# Patient Record
Sex: Female | Born: 1945 | Race: Black or African American | Hispanic: No | Smoking: Current every day smoker
Health system: Southern US, Community
[De-identification: ages and names within clinical notes are randomized; demographics above are authoritative.]

## PROBLEM LIST (undated history)

## (undated) DIAGNOSIS — E079 Disorder of thyroid, unspecified: Secondary | ICD-10-CM

## (undated) DIAGNOSIS — I10 Essential (primary) hypertension: Secondary | ICD-10-CM

## (undated) DIAGNOSIS — E119 Type 2 diabetes mellitus without complications: Secondary | ICD-10-CM

---

## 2021-04-20 ENCOUNTER — Emergency Department: Payer: Medicare HMO

## 2021-04-20 ENCOUNTER — Encounter: Payer: Self-pay | Admitting: Emergency Medicine

## 2021-04-20 ENCOUNTER — Other Ambulatory Visit: Payer: Self-pay

## 2021-04-20 DIAGNOSIS — I10 Essential (primary) hypertension: Secondary | ICD-10-CM | POA: Insufficient documentation

## 2021-04-20 DIAGNOSIS — F17210 Nicotine dependence, cigarettes, uncomplicated: Secondary | ICD-10-CM | POA: Insufficient documentation

## 2021-04-20 DIAGNOSIS — Z79899 Other long term (current) drug therapy: Secondary | ICD-10-CM | POA: Insufficient documentation

## 2021-04-20 DIAGNOSIS — E119 Type 2 diabetes mellitus without complications: Secondary | ICD-10-CM | POA: Diagnosis not present

## 2021-04-20 DIAGNOSIS — R030 Elevated blood-pressure reading, without diagnosis of hypertension: Secondary | ICD-10-CM | POA: Diagnosis present

## 2021-04-20 NOTE — ED Triage Notes (Signed)
Pt arrived via POV with reports of having a "funny feeling" states she had her daughter check her BP and it was elevated at home.  Pt takes Novasc, Losartan and Metoprolol and has not had any missed doses.  Pt states she also had some chest pain in the center of her chest that went away quickly.  Currently denies any pain.

## 2021-04-21 ENCOUNTER — Emergency Department
Admission: EM | Admit: 2021-04-21 | Discharge: 2021-04-21 | Disposition: A | Discharge status: Home / Self Care | Payer: Medicare HMO | Attending: Emergency Medicine | Admitting: Emergency Medicine

## 2021-04-21 ENCOUNTER — Encounter: Payer: Self-pay | Admitting: Radiology

## 2021-04-21 ENCOUNTER — Emergency Department: Payer: Medicare HMO

## 2021-04-21 DIAGNOSIS — I10 Essential (primary) hypertension: Secondary | ICD-10-CM

## 2021-04-21 HISTORY — DX: Disorder of thyroid, unspecified: E07.9

## 2021-04-21 HISTORY — DX: Essential (primary) hypertension: I10

## 2021-04-21 HISTORY — DX: Type 2 diabetes mellitus without complications: E11.9

## 2021-04-21 LAB — CBC
HCT: 45.9 % (ref 36.0–46.0)
Hemoglobin: 14.5 g/dL (ref 12.0–15.0)
MCH: 24.5 pg — ABNORMAL LOW (ref 26.0–34.0)
MCHC: 31.6 g/dL (ref 30.0–36.0)
MCV: 77.7 fL — ABNORMAL LOW (ref 80.0–100.0)
Platelets: 200 10*3/uL (ref 150–400)
RBC: 5.91 MIL/uL — ABNORMAL HIGH (ref 3.87–5.11)
RDW: 15.8 % — ABNORMAL HIGH (ref 11.5–15.5)
WBC: 9.8 10*3/uL (ref 4.0–10.5)
nRBC: 0 % (ref 0.0–0.2)

## 2021-04-21 LAB — URINALYSIS, COMPLETE (UACMP) WITH MICROSCOPIC
Bilirubin Urine: NEGATIVE
Glucose, UA: NEGATIVE mg/dL
Ketones, ur: NEGATIVE mg/dL
Leukocytes,Ua: NEGATIVE
Nitrite: NEGATIVE
Protein, ur: NEGATIVE mg/dL
Specific Gravity, Urine: 1.004 — ABNORMAL LOW (ref 1.005–1.030)
pH: 7 (ref 5.0–8.0)

## 2021-04-21 LAB — BASIC METABOLIC PANEL
Anion gap: 6 (ref 5–15)
BUN: 14 mg/dL (ref 8–23)
CO2: 27 mmol/L (ref 22–32)
Calcium: 9.5 mg/dL (ref 8.9–10.3)
Chloride: 108 mmol/L (ref 98–111)
Creatinine, Ser: 0.98 mg/dL (ref 0.44–1.00)
GFR, Estimated: 59 mL/min — ABNORMAL LOW (ref >60)
Glucose, Bld: 171 mg/dL — ABNORMAL HIGH (ref 70–99)
Potassium: 3.9 mmol/L (ref 3.5–5.1)
Sodium: 141 mmol/L (ref 135–145)

## 2021-04-21 LAB — TROPONIN I (HIGH SENSITIVITY)
Troponin I (High Sensitivity): 5 ng/L (ref <18)
Troponin I (High Sensitivity): 5 ng/L (ref <18)

## 2021-04-21 LAB — T4, FREE: Free T4: 0.91 ng/dL (ref 0.61–1.12)

## 2021-04-21 LAB — TSH: TSH: 3.57 u[IU]/mL (ref 0.350–4.500)

## 2021-04-21 MED ORDER — CLONIDINE HCL 0.1 MG PO TABS: ORAL_TABLET | ORAL | 0 refills | Status: AC

## 2021-04-21 NOTE — ED Provider Notes (Signed)
[facility] Emergency Department Provider Note   ____________________________________________   Event Date/Time   First MD Initiated Contact with Patient 04/21/21 0209     (approximate)  I have reviewed the triage vital signs and the nursing notes.   HISTORY  Chief Complaint Hypertension and Chest Pain    HPI Stefanie Roberts is a 75 y.o. female who presents to the ED from home with a chief complaint of elevated blood pressure.  Patient reports having a "funny feeling" and states her daughter checked her blood pressure and it was elevated.  Patient takes Norvasc, Losartan and Metoprolol.  She has taking these medicines for years and has not had any recent medication adjustments.  Reports transient chest pain not associated with diaphoresis, shortness of breath, palpitations, nausea, vomiting or dizziness.  Denies COVID-19 concerns.     Past Medical History:  Diagnosis Date   Diabetes mellitus without complication (HCC)    Hypertension    Thyroid disease     There are no problems to display for this patient.   History reviewed. No pertinent surgical history.  Prior to Admission medications   Medication Sig Start Date End Date Taking? Authorizing Provider  cloNIDine (CATAPRES) 0.1 MG tablet Take up to twice daily as needed for: SBP top blood pressure number > 200 DBP bottom blood pressure number > 100 04/21/21  Yes Irean Hong, MD    Allergies Darvon [propoxyphene] and Lisinopril  History reviewed. No pertinent family history.  Social History Social History   Tobacco Use   Smoking status: Every Day    Packs/day: 0.50    Types: Cigarettes   Smokeless tobacco: Never    Review of Systems  Constitutional: No fever/chills Eyes: No visual changes. ENT: No sore throat. Cardiovascular: Positive for chest pain. Respiratory: Denies shortness of breath. Gastrointestinal: No abdominal pain.  No nausea, no vomiting.  No diarrhea.  No  constipation. Genitourinary: Negative for dysuria. Musculoskeletal: Negative for back pain. Skin: Negative for rash. Neurological: Negative for headaches, focal weakness or numbness.   ____________________________________________   PHYSICAL EXAM:  VITAL SIGNS: ED Triage Vitals [04/20/21 2348]  Enc Vitals Group     BP (!) 173/100     Pulse Rate 82     Resp 18     Temp 97.7 F (36.5 C)     Temp Source Oral     SpO2 96 %     Weight 127 lb (57.6 kg)     Height 5\' 6"  (1.676 m)     Head Circumference      Peak Flow      Pain Score 0     Pain Loc      Pain Edu?      Excl. in GC?     Constitutional: Alert and oriented. Well appearing and in no acute distress. Eyes: Conjunctivae are normal. PERRL. EOMI. Head: Atraumatic. Nose: No congestion/rhinnorhea. Mouth/Throat: Mucous membranes are moist.   Neck: No stridor.  No thyromegaly. Cardiovascular: Normal rate, regular rhythm. Grossly normal heart sounds.  Good peripheral circulation. Respiratory: Normal respiratory effort.  No retractions. Lungs CTAB. Gastrointestinal: Soft and nontender to light or deep palpation. No distention. No abdominal bruits. No CVA tenderness. Musculoskeletal: No lower extremity tenderness nor edema.  No joint effusions. Neurologic: Alert and oriented x3.  CN II to XII grossly intact.  Normal speech and language. No gross focal neurologic deficits are appreciated. No gait instability. Skin:  Skin is warm, dry and intact. No rash noted. Psychiatric:  Mood and affect are normal. Speech and behavior are normal.  ____________________________________________   LABS (all labs ordered are listed, but only abnormal results are displayed)  Labs Reviewed  BASIC METABOLIC PANEL - Abnormal; Notable for the following components:      Result Value   Glucose, Bld 171 (*)    GFR, Estimated 59 (*)    All other components within normal limits  CBC - Abnormal; Notable for the following components:   RBC 5.91 (*)     MCV 77.7 (*)    MCH 24.5 (*)    RDW 15.8 (*)    All other components within normal limits  URINALYSIS, COMPLETE (UACMP) WITH MICROSCOPIC - Abnormal; Notable for the following components:   Color, Urine COLORLESS (*)    APPearance CLEAR (*)    Specific Gravity, Urine 1.004 (*)    Hgb urine dipstick SMALL (*)    Bacteria, UA RARE (*)    All other components within normal limits  TSH  T4, FREE  TROPONIN I (HIGH SENSITIVITY)  TROPONIN I (HIGH SENSITIVITY)   ____________________________________________  EKG  ED ECG REPORT I, Lorijean Husser J, the attending physician, personally viewed and interpreted this ECG.   Date: 04/21/2021  EKG Time: 2353  Rate: 79  Rhythm: normal sinus rhythm  Axis: Normal  Intervals: Premature supraventricular beats  ST&T Change: Nonspecific  ____________________________________________  RADIOLOGY Tawni Millers J, personally viewed and evaluated these images (plain radiographs) as part of my medical decision making, as well as reviewing the written report by the radiologist.  ED MD interpretation: No acute cardiopulmonary process  Official radiology report(s): DG Chest 2 View  Result Date: 04/21/2021 CLINICAL DATA:  Chest pain EXAM: CHEST - 2 VIEW COMPARISON:  None. FINDINGS: Lungs are well expanded, symmetric, and clear. No pneumothorax or pleural effusion. Cardiac size within normal limits. Pulmonary vascularity is normal. Osseous structures are age-appropriate. No acute bone abnormality. IMPRESSION: No active cardiopulmonary disease. Electronically Signed   By: Helyn Numbers MD   On: 04/21/2021 00:23    ____________________________________________   PROCEDURES  Procedure(s) performed (including Critical Care):  .1-3 Lead EKG Interpretation  Date/Time: 04/21/2021 2:39 AM Performed by: Irean Hong, MD Authorized by: Irean Hong, MD     Interpretation: normal     ECG rate:  80   ECG rate assessment: normal     Rhythm: sinus rhythm      Ectopy: none     Conduction: normal   Comments:     Patient placed on cardiac monitor to evaluate for arrhythmias   ____________________________________________   INITIAL IMPRESSION / ASSESSMENT AND PLAN / ED COURSE  As part of my medical decision making, I reviewed the following data within the electronic MEDICAL RECORD NUMBER History obtained from family, Nursing notes reviewed and incorporated, Labs reviewed, EKG interpreted, Old chart reviewed, Radiograph reviewed, and Notes from prior ED visits     75 year old female with hypertension presenting with elevated blood pressure and transient chest pain. Differential diagnosis includes, but is not limited to, ACS, aortic dissection, pulmonary embolism, cardiac tamponade, pneumothorax, pneumonia, pericarditis, myocarditis, GI-related causes including esophagitis/gastritis, and musculoskeletal chest wall pain.     Initial troponin, EKG and chest x-ray unremarkable.  Will repeat troponin, check thyroid panel and reassess.  Clinical Course as of 04/21/21 0456  Wynelle Link Apr 21, 2021  0223 Patient's antihypertensives: Amlodipine 10mg  qd Metoprolol 25mg  bid Losartan 100mg  [JS]  Updated patient and daughter on all test results.  Blood pressure improved without intervention.  Will discharge home with prescription for clonidine to use as needed with strict parameters.  Patient will follow up with her PCP early next week.  Strict return precautions given.  Both verbalized understanding agree with plan of care. [JS]    Clinical Course User Index [JS] Irean Hong, MD     ____________________________________________   FINAL CLINICAL IMPRESSION(S) / ED DIAGNOSES  Final diagnoses:  Hypertension, unspecified type     ED Discharge Orders          Ordered    cloNIDine (CATAPRES) 0.1 MG tablet        04/21/21 0326             Note:  This document was prepared using Dragon voice recognition software and may include unintentional  dictation errors.    Irean Hong, MD 04/21/21 [phone]

## 2021-04-21 NOTE — ED Notes (Signed)
Pt. Placed on cont. Cardiac monitoring. NAD.

## 2021-04-21 NOTE — Discharge Instructions (Addendum)
1.  You may take Clonidine 0.1mg  up to twice daily for: Top number of blood pressure>200 Bottom number of blood pressure>100 2.  If you are able to take your blood pressure at home, record a log of your blood pressures morning, noon and evening.  Please bring these to your doctor next week. 3.  Return to the ER for worsening symptoms, persistent vomiting, difficulty breathing or other concerns.

## 2021-09-14 ENCOUNTER — Emergency Department
Admission: EM | Admit: 2021-09-14 | Discharge: 2021-09-14 | Disposition: A | Discharge status: Home / Self Care | Payer: Medicare HMO | Attending: Emergency Medicine | Admitting: Emergency Medicine

## 2021-09-14 ENCOUNTER — Other Ambulatory Visit: Payer: Self-pay

## 2021-09-14 ENCOUNTER — Encounter: Payer: Self-pay | Admitting: Emergency Medicine

## 2021-09-14 DIAGNOSIS — U071 COVID-19: Secondary | ICD-10-CM | POA: Insufficient documentation

## 2021-09-14 DIAGNOSIS — E119 Type 2 diabetes mellitus without complications: Secondary | ICD-10-CM | POA: Diagnosis not present

## 2021-09-14 DIAGNOSIS — F17210 Nicotine dependence, cigarettes, uncomplicated: Secondary | ICD-10-CM | POA: Diagnosis not present

## 2021-09-14 DIAGNOSIS — I10 Essential (primary) hypertension: Secondary | ICD-10-CM | POA: Insufficient documentation

## 2021-09-14 DIAGNOSIS — R059 Cough, unspecified: Secondary | ICD-10-CM | POA: Diagnosis present

## 2021-09-14 LAB — RESP PANEL BY RT-PCR (FLU A&B, COVID) ARPGX2
Influenza A by PCR: NEGATIVE
Influenza B by PCR: NEGATIVE
SARS Coronavirus 2 by RT PCR: POSITIVE — AB

## 2021-09-14 NOTE — Discharge Instructions (Signed)
You can take Tylenol and Ibuprofen alternating at home for Fever.

## 2021-09-14 NOTE — ED Triage Notes (Signed)
Pt via POV from home. Daughter wanted her to be tested for COVID. Family has been sick. Pt denies any symptoms. Pt is A&Ox4 and NAD

## 2021-09-14 NOTE — ED Provider Notes (Signed)
[facility]-EMERGENCY DEPARTMENT  ____________________________________________  Time seen: Approximately 5:54 PM  I have reviewed the triage vital signs and the nursing notes.   HISTORY  Chief Complaint Covid Exposure   Historian Patient     HPI Stefanie Roberts is a 75 y.o. female with a history of diabetes and hypertension, presents to the emergency department for COVID-19 testing.  Patient has had sporadic cough but denies other symptoms.  Multiple family members in the home of tested positive for COVID-19.  No chest pain, chest tightness or abdominal pain.   Past Medical History:  Diagnosis Date   Diabetes mellitus without complication (HCC)    Hypertension    Thyroid disease      Immunizations up to date:  Yes.     Past Medical History:  Diagnosis Date   Diabetes mellitus without complication (HCC)    Hypertension    Thyroid disease     There are no problems to display for this patient.   History reviewed. No pertinent surgical history.  Prior to Admission medications   Medication Sig Start Date End Date Taking? Authorizing Provider  cloNIDine (CATAPRES) 0.1 MG tablet Take up to twice daily as needed for: SBP top blood pressure number > 200 DBP bottom blood pressure number > 100 04/21/21   Irean Hong, MD    Allergies Darvon [propoxyphene] and Lisinopril  History reviewed. No pertinent family history.  Social History Social History   Tobacco Use   Smoking status: Every Day    Packs/day: 0.50    Types: Cigarettes   Smokeless tobacco: Never     Review of Systems  Constitutional: No fever/chills Eyes:  No discharge ENT: No upper respiratory complaints. Respiratory: Patient has cough.  Gastrointestinal:   No nausea, no vomiting.  No diarrhea.  No constipation. Musculoskeletal: Negative for musculoskeletal pain. Skin: Negative for rash, abrasions, lacerations, ecchymosis.  ____________________________________________   PHYSICAL EXAM:  VITAL  SIGNS: ED Triage Vitals  Enc Vitals Group     BP 09/14/21 1548 (!) 159/77     Pulse Rate 09/14/21 1548 87     Resp 09/14/21 1548 18     Temp 09/14/21 1548 99.2 F (37.3 C)     Temp Source 09/14/21 1548 Oral     SpO2 09/14/21 1548 94 %     Weight 09/14/21 1555 125 lb (56.7 kg)     Height 09/14/21 1555 5\' 6"  (1.676 m)     Head Circumference --      Peak Flow --      Pain Score 09/14/21 1554 0     Pain Loc --      Pain Edu? --      Excl. in GC? --      Constitutional: Alert and oriented. Patient is lying supine. Eyes: Conjunctivae are normal. PERRL. EOMI. Head: Atraumatic. ENT:      Ears: Tympanic membranes are mildly injected with mild effusion bilaterally.       Nose: No congestion/rhinnorhea.      Mouth/Throat: Mucous membranes are moist. Posterior pharynx is mildly erythematous.  Hematological/Lymphatic/Immunilogical: No cervical lymphadenopathy.  Cardiovascular: Normal rate, regular rhythm. Normal S1 and S2.  Good peripheral circulation. Respiratory: Normal respiratory effort without tachypnea or retractions. Lungs CTAB. Good air entry to the bases with no decreased or absent breath sounds. Gastrointestinal: Bowel sounds 4 quadrants. Soft and nontender to palpation. No guarding or rigidity. No palpable masses. No distention. No CVA tenderness. Musculoskeletal: Full range of motion to all extremities. No gross deformities appreciated.  Neurologic:  Normal speech and language. No gross focal neurologic deficits are appreciated.  Skin:  Skin is warm, dry and intact. No rash noted. Psychiatric: Mood and affect are normal. Speech and behavior are normal. Patient exhibits appropriate insight and judgement.   ____________________________________________   LABS (all labs ordered are listed, but only abnormal results are displayed)  Labs Reviewed  RESP PANEL BY RT-PCR (FLU A&B, COVID) ARPGX2 - Abnormal; Notable for the following components:      Result Value   SARS Coronavirus  2 by RT PCR POSITIVE (*)    All other components within normal limits   ____________________________________________  EKG   ____________________________________________  RADIOLOGY   No results found.  ____________________________________________    PROCEDURES  Procedure(s) performed:     Procedures     Medications - No data to display   ____________________________________________   INITIAL IMPRESSION / ASSESSMENT AND PLAN / ED COURSE  Pertinent labs & imaging results that were available during my care of the patient were reviewed by me and considered in my medical decision making (see chart for details).    Assessment and plan COVID-30 75 year old female presents to the emergency department with cold-like symptoms with sick contacts in the home that tested positive for COVID-19.  Patient tested positive for COVID-19.  Patient declined Paxlovid.   Tylenol and ibuprofen alternating were recommended for fever.  Rest and hydration were encouraged at home.  Return precautions were given to return with new or worsening symptoms.      ____________________________________________  FINAL CLINICAL IMPRESSION(S) / ED DIAGNOSES  Final diagnoses:  COVID-19      NEW MEDICATIONS STARTED DURING THIS VISIT:  ED Discharge Orders     None           This chart was dictated using voice recognition software/Dragon. Despite best efforts to proofread, errors can occur which can change the meaning. Any change was purely unintentional.     Orvil Feil, PA-C 09/14/21 1757    Concha Se, MD 09/14/21 [phone]

## 2022-01-13 ENCOUNTER — Encounter: Payer: Self-pay | Admitting: Emergency Medicine

## 2022-01-13 ENCOUNTER — Emergency Department
Admission: EM | Admit: 2022-01-13 | Discharge: 2022-01-13 | Disposition: A | Discharge status: Home / Self Care | Payer: Medicare Other | Attending: Emergency Medicine | Admitting: Emergency Medicine

## 2022-01-13 ENCOUNTER — Emergency Department: Payer: Medicare Other

## 2022-01-13 DIAGNOSIS — R079 Chest pain, unspecified: Secondary | ICD-10-CM | POA: Diagnosis present

## 2022-01-13 DIAGNOSIS — R0789 Other chest pain: Secondary | ICD-10-CM | POA: Diagnosis not present

## 2022-01-13 DIAGNOSIS — I10 Essential (primary) hypertension: Secondary | ICD-10-CM | POA: Insufficient documentation

## 2022-01-13 DIAGNOSIS — I2510 Atherosclerotic heart disease of native coronary artery without angina pectoris: Secondary | ICD-10-CM | POA: Insufficient documentation

## 2022-01-13 LAB — URINALYSIS, ROUTINE W REFLEX MICROSCOPIC
Bilirubin Urine: NEGATIVE
Glucose, UA: NEGATIVE mg/dL
Hgb urine dipstick: NEGATIVE
Ketones, ur: NEGATIVE mg/dL
Leukocytes,Ua: NEGATIVE
Nitrite: NEGATIVE
Protein, ur: NEGATIVE mg/dL
Specific Gravity, Urine: 1.004 — ABNORMAL LOW (ref 1.005–1.030)
pH: 7 (ref 5.0–8.0)

## 2022-01-13 LAB — BASIC METABOLIC PANEL
Anion gap: 8 (ref 5–15)
BUN: 18 mg/dL (ref 8–23)
CO2: 25 mmol/L (ref 22–32)
Calcium: 9.8 mg/dL (ref 8.9–10.3)
Chloride: 105 mmol/L (ref 98–111)
Creatinine, Ser: 0.93 mg/dL (ref 0.44–1.00)
GFR, Estimated: >60 mL/min (ref >60)
Glucose, Bld: 133 mg/dL — ABNORMAL HIGH (ref 70–99)
Potassium: 3.8 mmol/L (ref 3.5–5.1)
Sodium: 138 mmol/L (ref 135–145)

## 2022-01-13 LAB — CBC
HCT: 40.4 % (ref 36.0–46.0)
Hemoglobin: 12.2 g/dL (ref 12.0–15.0)
MCH: 24 pg — ABNORMAL LOW (ref 26.0–34.0)
MCHC: 30.2 g/dL (ref 30.0–36.0)
MCV: 79.5 fL — ABNORMAL LOW (ref 80.0–100.0)
Platelets: 225 10*3/uL (ref 150–400)
RBC: 5.08 MIL/uL (ref 3.87–5.11)
RDW: 15.5 % (ref 11.5–15.5)
WBC: 9.5 10*3/uL (ref 4.0–10.5)
nRBC: 0 % (ref 0.0–0.2)

## 2022-01-13 LAB — TROPONIN I (HIGH SENSITIVITY)
Troponin I (High Sensitivity): 5 ng/L (ref <18)
Troponin I (High Sensitivity): 5 ng/L (ref <18)

## 2022-01-13 NOTE — ED Notes (Signed)
MD at the bedside  

## 2022-01-13 NOTE — ED Triage Notes (Signed)
Pt presents via POV with complaints of midsternal CP that radiates towards the left side of her chest that started 1 hour ago. Hx of HTN and is complaint with her medications. Denies SOB.  ?

## 2022-01-13 NOTE — ED Notes (Signed)
Pt ambulated to BR via self, steady gait noted, pt reports no complaints  ?

## 2022-01-13 NOTE — ED Provider Notes (Signed)
? ?[facility] ?Provider Note ? ? ? Event Date/Time  ? First MD Initiated Contact with Patient 01/13/22 2014   ?  (approximate) ? ? ?History  ? ?Chest Pain ? ? ?HPI ? ?Stefanie Roberts is a 76 y.o. female here with transient chest pain.  The patient states that prior to arrival, she was talking with her family when she experienced acute, severe, sharp, pain that shot across her chest.  It lasted less than 1 to 2 minutes.  It then resolved.  She does not feel any shortness of breath with this.  She did not feel any nausea.  No diaphoresis.  Symptoms have now resolved.  She has a history of atypical chest pain, and did have stress test within the last few years which was normal.  Denies any recent medication changes.  No fevers or chills.  No recent sick contacts. ?  ? ? ?Physical Exam  ? ?Triage Vital Signs: ?ED Triage Vitals  ?Enc Vitals Group  ?   BP 01/13/22 1903 (!) 180/76  ?   Pulse Rate 01/13/22 1903 69  ?   Resp 01/13/22 1903 18  ?   Temp 01/13/22 1903 98 ?F (36.7 ?C)  ?   Temp src --   ?   SpO2 01/13/22 1903 100 %  ?   Weight 01/13/22 1902 132 lb 4.4 oz (60 kg)  ?   Height 01/13/22 1902 5\' 6"  (1.676 m)  ?   Head Circumference --   ?   Peak Flow --   ?   Pain Score 01/13/22 1901 2  ?   Pain Loc --   ?   Pain Edu? --   ?   Excl. in GC? --   ? ? ?Most recent vital signs: ?Vitals:  ? 01/13/22 2200 01/13/22 2230  ?BP: (!) 154/72 (!) 159/103  ?Pulse: (!) 52 65  ?Resp: 20 18  ?Temp:    ?SpO2: 97% 96%  ? ? ? ?General: Awake, no distress.  ?CV:  Good peripheral perfusion.  Regular rate and rhythm.  No murmurs or rubs. ?Resp:  Normal effort.  Lungs clear to auscultation bilaterally. ?Abd:  No distention.  No tenderness. ?Other:  No chest wall tenderness.  No lower extremity edema. ? ? ?ED Results / Procedures / Treatments  ? ?Labs ?(all labs ordered are listed, but only abnormal results are displayed) ?Labs Reviewed  ?BASIC METABOLIC PANEL - Abnormal; Notable for the following components:  ?     Result Value  ? Glucose, Bld 133 (*)   ? All other components within normal limits  ?CBC - Abnormal; Notable for the following components:  ? MCV 79.5 (*)   ? MCH 24.0 (*)   ? All other components within normal limits  ?URINALYSIS, ROUTINE W REFLEX MICROSCOPIC - Abnormal; Notable for the following components:  ? Color, Urine COLORLESS (*)   ? APPearance CLEAR (*)   ? Specific Gravity, Urine 1.004 (*)   ? All other components within normal limits  ?TROPONIN I (HIGH SENSITIVITY)  ?TROPONIN I (HIGH SENSITIVITY)  ? ? ? ?EKG ?Sinus rhythm with PACs.  PR 132, QRS 64, QTc 455.  No acute ST elevations or depressions. ? ? ?RADIOLOGY ?Chest x-ray: No active disease ? ? ?I also independently reviewed and agree wit radiologist interpretations. ? ? ?PROCEDURES: ? ?Critical Care performed: No ? ? ?.1-3 Lead EKG Interpretation ?Performed by: [id number], MD ?Authorized by: Shaune Pollack, MD  ? ?  Interpretation: normal   ?  ECG rate:  60-80 ?  ECG rate assessment: normal   ?  Rhythm: sinus rhythm   ?  Ectopy: none   ?  Conduction: normal   ?Comments:  ?   Indication: Chest pain ? ? ? ?MEDICATIONS ORDERED IN ED: ?Medications - No data to display ? ? ?IMPRESSION / MDM / ASSESSMENT AND PLAN / ED COURSE  ?I reviewed the triage vital signs and the nursing notes. ?             ?               ? ? ?The patient is on the cardiac monitor to evaluate for evidence of arrhythmia and/or significant heart rate changes. ? ? ?Ddx:  ?Chest wall pain, GERD/esophagitis, atypical CP, ACS, PE, PNA, PTX, atelectasis ? ? ?MDM:  ?Very pleasant 76 yo F with h/o CAD, HTN, here with fairly atypical chest pain. Suspect MSK chest wall pain given sharp, transient nature with subsequent complete resolution. Pt has a frequent cough related to prior tobacco abuse, and has been lifting her grandson (>30 lb) more than usual. EKg is nonischemic here and trop neg x 2, do not suspect ACS. I reviewed her prior cardiology notes and while she did have high Ca  score, she had a fairly recent neg stress test. CXR reviewed and is clear. Pain is not persistent, and she has no sharp or tearing component, pulses symmetric, do not suspect dissection. No clinical signs of PE/DvT. Labs o/w reassuring. No significant leukocytosis or anemia. No UTI/signs of pyelo. BMP with normal renal function. Will tx supportively with good return precautions.  ? ? ?MEDICATIONS GIVEN IN ED: ?Medications - No data to display ? ? ?Consults:  ?None ? ? ?EMR reviewed  ?Prior cardiology and PCP notes ? ? ? ? ?FINAL CLINICAL IMPRESSION(S) / ED DIAGNOSES  ? ?Final diagnoses:  ?Atypical chest pain  ? ? ? ?Rx / DC Orders  ? ?ED Discharge Orders   ? ? None  ? ?  ? ? ? ?Note:  This document was prepared using Dragon voice recognition software and may include unintentional dictation errors. ?  ?Shaune Pollack, MD ?01/14/22 1228 ? ?

## 2022-10-25 IMAGING — CR DG CHEST 2V
1 series · 2 of 2 positions shown · non-contrast
Comparison: 04/21/2021

CLINICAL DATA: Chest pain

EXAM:
CHEST - 2 VIEW

[Series 1: dg chest 2 view · 0.14mm/px · 2 of 2 slices shown]
[im 1/2]
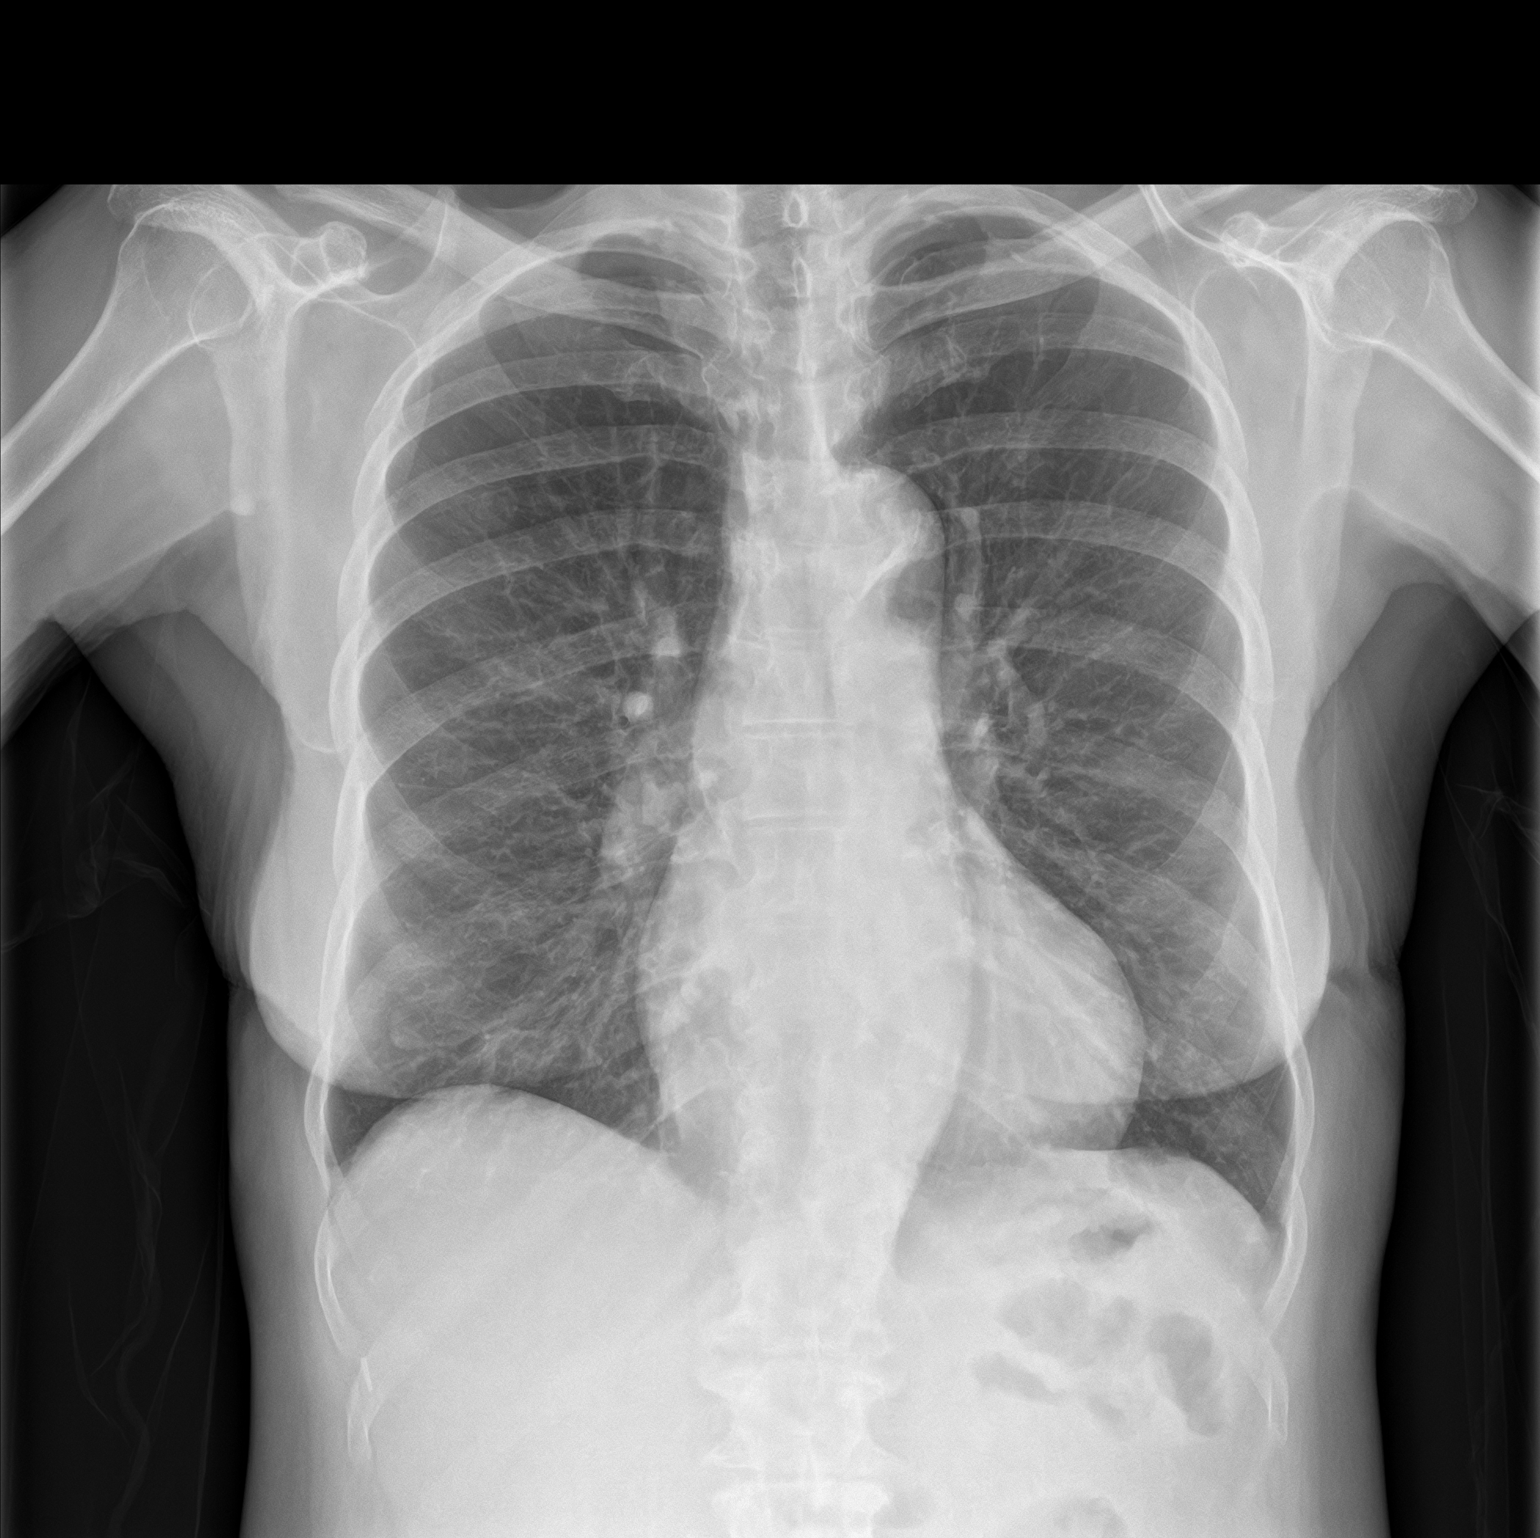
[im 2/2]
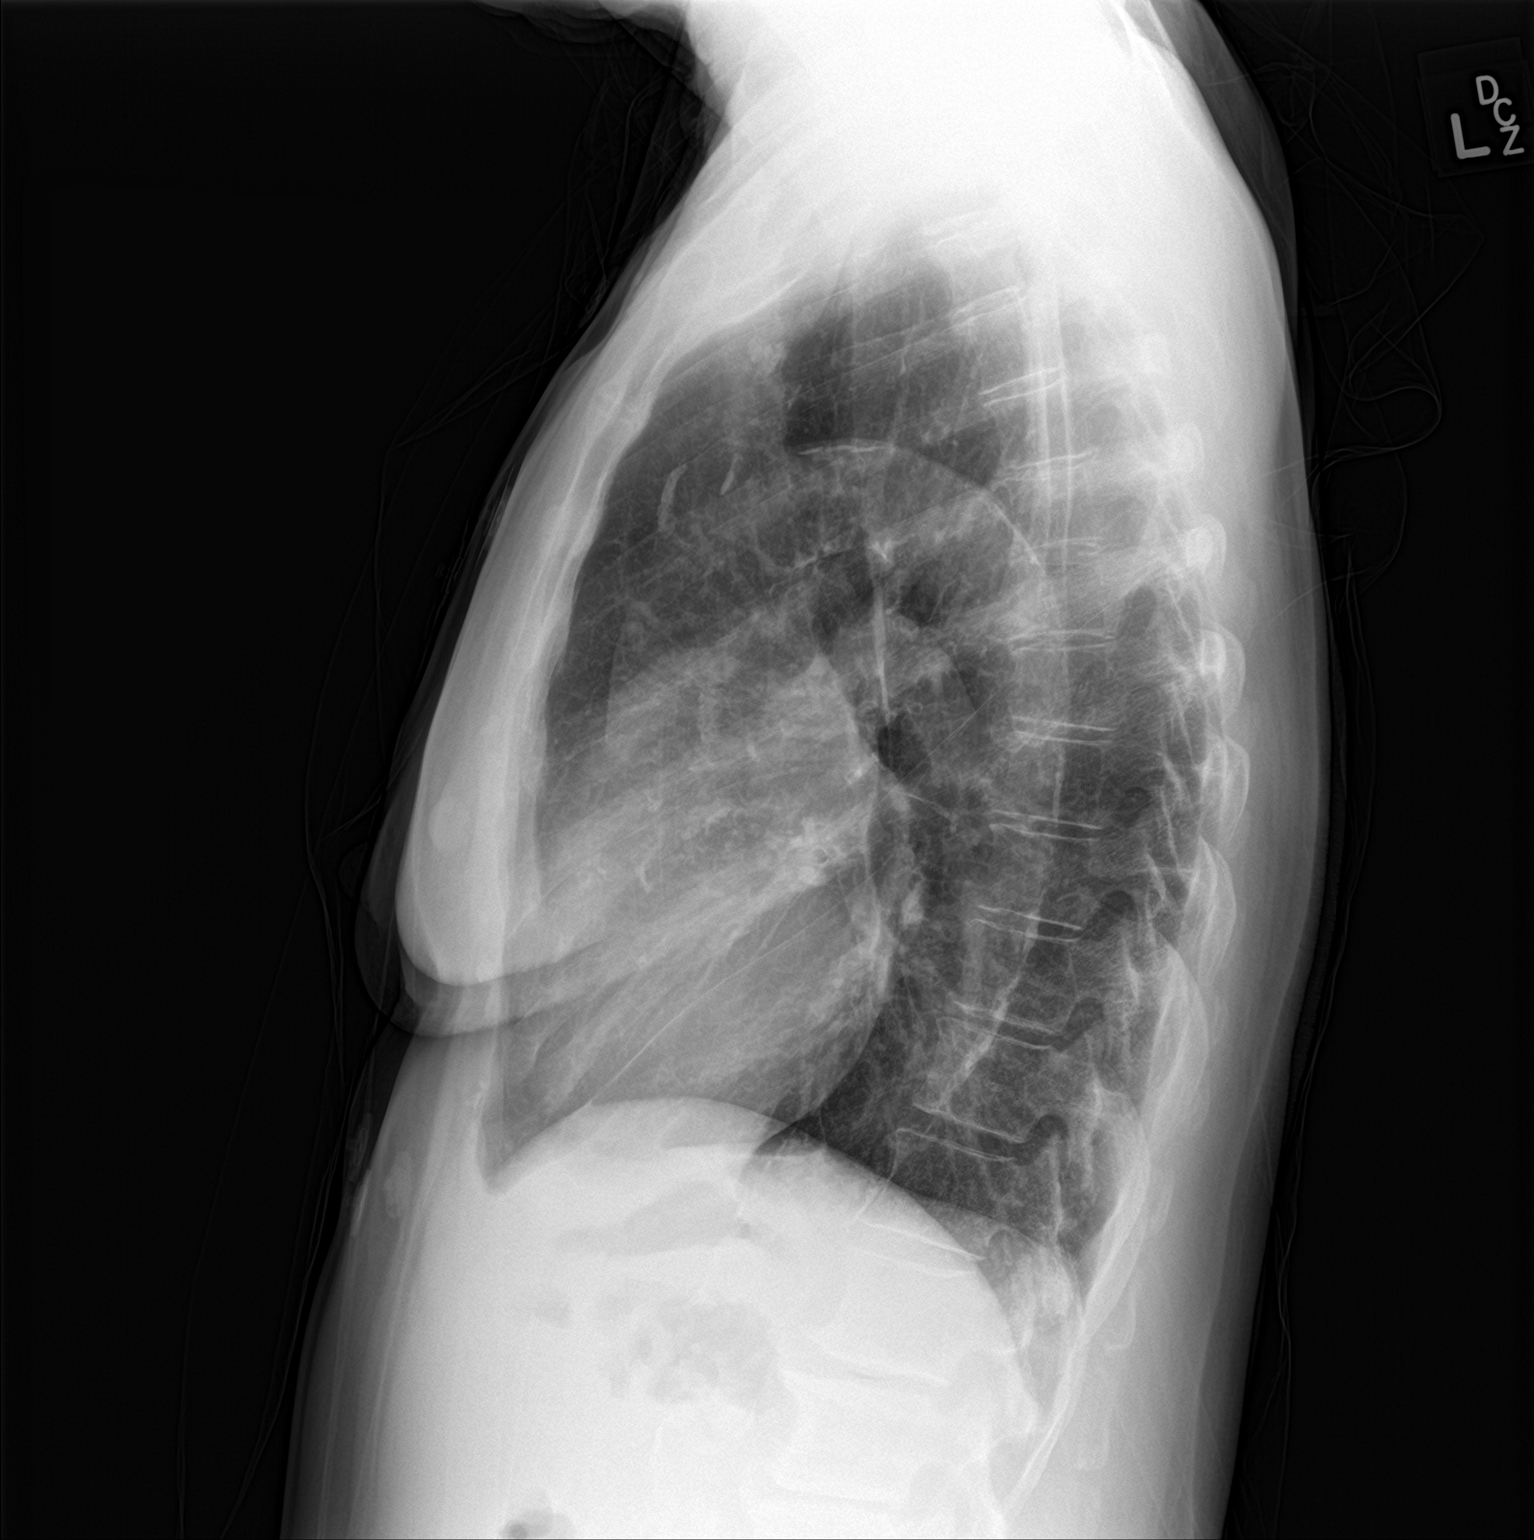

[2 of 2 positions shown; findings below may reference images not displayed]

FINDINGS: The heart size and mediastinal contours are within normal limits.
Both lungs are clear. The visualized skeletal structures are
unremarkable.
IMPRESSION: No active cardiopulmonary disease.

## 2023-02-22 ENCOUNTER — Other Ambulatory Visit: Payer: Self-pay

## 2023-02-22 ENCOUNTER — Emergency Department
Admission: EM | Admit: 2023-02-22 | Discharge: 2023-02-22 | Disposition: A | Discharge status: Home / Self Care | Payer: 59 | Attending: Emergency Medicine | Admitting: Emergency Medicine

## 2023-02-22 DIAGNOSIS — R040 Epistaxis: Secondary | ICD-10-CM | POA: Diagnosis not present

## 2023-02-22 DIAGNOSIS — I10 Essential (primary) hypertension: Secondary | ICD-10-CM | POA: Insufficient documentation

## 2023-02-22 DIAGNOSIS — Z7982 Long term (current) use of aspirin: Secondary | ICD-10-CM | POA: Diagnosis not present

## 2023-02-22 DIAGNOSIS — M5431 Sciatica, right side: Secondary | ICD-10-CM | POA: Diagnosis not present

## 2023-02-22 DIAGNOSIS — E119 Type 2 diabetes mellitus without complications: Secondary | ICD-10-CM | POA: Diagnosis not present

## 2023-02-22 MED ORDER — TRAMADOL HCL 50 MG PO TABS
50.0000 mg | ORAL_TABLET | Freq: Once | ORAL | Status: DC
  Filled 2023-02-22: qty 1

## 2023-02-22 MED ORDER — TRANEXAMIC ACID FOR EPISTAXIS
500.0000 mg | Freq: Once | TOPICAL | Status: DC
  Filled 2023-02-22: qty 10

## 2023-02-22 MED ORDER — OXYMETAZOLINE HCL 0.05 % NA SOLN
1.0000 | Freq: Once | NASAL | Status: AC
  Administered 2023-02-22: 1 via NASAL
  Filled 2023-02-22: qty 30

## 2023-02-22 MED ORDER — TRAMADOL HCL 50 MG PO TABS: 50.0000 mg | ORAL_TABLET | Freq: Four times a day (QID) | ORAL | 0 refills | Status: AC | PRN

## 2023-02-22 MED ORDER — ACETAMINOPHEN 325 MG PO TABS
650.0000 mg | ORAL_TABLET | Freq: Once | ORAL | Status: AC
  Administered 2023-02-22: 650 mg via ORAL
  Filled 2023-02-22: qty 2

## 2023-02-22 NOTE — ED Triage Notes (Signed)
Pt to ed from home for nose bleed that started about 15 mins. Pt advised she was just sitting at home and it started. Pt is CAOx4, in no acute distress and ambulatory in triage. Pt has very minimal bleeding at this time. Pressure applied with towel.

## 2023-02-22 NOTE — Discharge Instructions (Signed)
Please follow-up with primary care if your nosebleed becomes recurrent.  He may use 1 spray and the nostril if the bleeding returns then apply the clamp for 15 minutes.  If bleeding has not stopped, instill 1 additional spray and then clamp again.  If bleeding does not stop after that, you will need to return to the emergency department.

## 2023-02-22 NOTE — ED Provider Notes (Signed)
[facility] Provider Note    Event Date/Time   First MD Initiated Contact with Patient 02/22/23 2103     (approximate)   History   Epistaxis (X 15 mins)   HPI  Stefanie Roberts is a 77 y.o. female with history of hypertension and diabetes and as listed in EMR presents to the emergency department for treatment and evaluation of nosebleed.  Patient states that she was sitting at home watching television when her nose started bleeding.  She is not currently on anticoagulants except for daily baby aspirin.  She is also complaining of right side back pain that radiates into her right lower extremity.  She has had this in the past and typically takes Tylenol but it is not working today.     Physical Exam   Triage Vital Signs: ED Triage Vitals [02/22/23 1917]  Enc Vitals Group     BP 139/76     Pulse Rate 88     Resp 16     Temp 98 F (36.7 C)     Temp Source Oral     SpO2 95 %     Weight 145 lb (65.8 kg)     Height 5\' 6"  (1.676 m)     Head Circumference      Peak Flow      Pain Score 0     Pain Loc      Pain Edu?      Excl. in GC?     Most recent vital signs: Vitals:   02/22/23 1917  BP: 139/76  Pulse: 88  Resp: 16  Temp: 98 F (36.7 C)  SpO2: 95%    General: Awake, no distress.  CV:  Good peripheral perfusion.  Resp:  Normal effort.  Abd:  No distention.  Other:  Scant amount of blood noted in the anterior left nare.   No focal neuro deficits. Straight leg raise is negative. No weakness in the RLE.    ED Results / Procedures / Treatments   Labs (all labs ordered are listed, but only abnormal results are displayed) Labs Reviewed - No data to display   EKG  Not indicated   RADIOLOGY  Image and radiology report reviewed and interpreted by me. Radiology report consistent with the same.  Not indicated.  PROCEDURES:  Critical Care performed: No  Procedures   MEDICATIONS ORDERED IN ED:  Medications  tranexamic acid  (CYKLOKAPRON) 1000 MG/10ML topical solution 500 mg (500 mg Topical Not Given 02/22/23 2214)  traMADol (ULTRAM) tablet 50 mg (50 mg Oral Patient Refused/Not Given 02/22/23 2225)  oxymetazoline (AFRIN) 0.05 % nasal spray 1 spray (1 spray Each Nare Given 02/22/23 1923)  acetaminophen (TYLENOL) tablet 650 mg (650 mg Oral Given 02/22/23 2225)     IMPRESSION / MDM / ASSESSMENT AND PLAN / ED COURSE   I have reviewed the triage note.  Differential diagnosis includes, but is not limited to, anterior epistaxis, posterior epistaxis, acute on chronic back pain, sciatica  Patient's presentation is most consistent with acute illness / injury with system symptoms.  77 year old female presenting to the emergency department for evaluation of epistaxis that started suddenly while watching television.  While awaiting ER room assignment, Afrin was instilled and clamp applied to the nose.  On exam, she has a scant amount of blood just inside the left nostril.  No blood noted in the oropharynx.  Patient is continuously wiping her nose because she feels that it is running.  She was encouraged  to avoid this to see if the bleeding would stop.  Pain in her back is in the right lower sacral area and radiates into the posterior aspect of the right thigh.  Her neuroexam is normal.  She is able to ambulate without assistance.  No further epistaxis.  No need for TXA or packing.  She was encouraged to use 1 spray of Afrin and then clamp for 15 minutes if bleeding returns.  If that process was repeated a second time and she continues to have nosebleeds she was encouraged to return to the emergency department.  In regard to her acute on chronic back pain, she is to follow-up with her primary care provider if Tylenol and tramadol do not help.  She was advised that the tramadol may make her sleepy, drowsy, or dizzy.  She was encouraged to change positions slowly.  If symptoms change or worsen and she is unable to see her primary care  provider she was advised to return to the emergency department.     FINAL CLINICAL IMPRESSION(S) / ED DIAGNOSES   Final diagnoses:  Epistaxis  Sciatica of right side     Rx / DC Orders   ED Discharge Orders          Ordered    traMADol (ULTRAM) 50 MG tablet  Every 6 hours PRN        02/22/23 2216             Note:  This document was prepared using Dragon voice recognition software and may include unintentional dictation errors.   Chinita Pester, FNP 02/22/23 2349    Merwyn Katos, MD 02/23/23 1540

## 2023-02-22 NOTE — ED Notes (Signed)
Clamp placed on nose

## 2023-11-26 ENCOUNTER — Encounter: Payer: Self-pay | Admitting: Emergency Medicine

## 2023-11-26 ENCOUNTER — Emergency Department
Admission: EM | Admit: 2023-11-26 | Discharge: 2023-11-26 | Disposition: A | Discharge status: Home / Self Care | Attending: Emergency Medicine | Admitting: Emergency Medicine

## 2023-11-26 ENCOUNTER — Other Ambulatory Visit: Payer: Self-pay

## 2023-11-26 DIAGNOSIS — R040 Epistaxis: Secondary | ICD-10-CM

## 2023-11-26 MED ORDER — OXYMETAZOLINE HCL 0.05 % NA SOLN
1.0000 | Freq: Once | NASAL | Status: AC
  Administered 2023-11-26: 1 via NASAL
  Filled 2023-11-26: qty 30

## 2023-11-26 MED ORDER — SILVER NITRATE-POT NITRATE 75-25 % EX MISC
CUTANEOUS | Status: AC
  Filled 2023-11-26: qty 10

## 2023-11-26 NOTE — ED Provider Notes (Signed)
 [facility] Provider Note    Event Date/Time   First MD Initiated Contact with Patient 11/26/23 2010     (approximate)   History   Epistaxis   HPI  Stefanie Roberts is a 78 y.o. female   who presents today with history of epistaxis mainly for the left nostril.  Patient states this is a common time that she visits emergency department for epistaxis.      Physical Exam   Triage Vital Signs: ED Triage Vitals  Encounter Vitals Group     BP 11/26/23 1828 (!) 149/81     Systolic BP Percentile --      Diastolic BP Percentile --      Pulse Rate 11/26/23 1828 98     Resp 11/26/23 1828 18     Temp 11/26/23 1828 98.7 F (37.1 C)     Temp Source 11/26/23 1828 Oral     SpO2 11/26/23 1828 95 %     Weight 11/26/23 1824 135 lb (61.2 kg)     Height 11/26/23 1824 5\' 6"  (1.676 m)     Head Circumference --      Peak Flow --      Pain Score 11/26/23 1823 0     Pain Loc --      Pain Education --      Exclude from Growth Chart --     Most recent vital signs: Vitals:   11/26/23 1828  BP: (!) 149/81  Pulse: 98  Resp: 18  Temp: 98.7 F (37.1 C)  SpO2: 95%     Constitutional: Alert, NAD. Able to speak in complete sentences without cough or dyspnea  Eyes: Conjunctivae are normal.  Head: Atraumatic. Nose: No congestion/rhinnorhea. Mouth/Throat: Mucous membranes are moist.  Removed with a nasal clip, evidence of mild epistaxis from both nostrils. Neck: Painless ROM. Supple. No JVD, nodes, thyromegaly  Cardiovascular:   Good peripheral circulation.RRR no murmurs, gallops, rubs  Respiratory: Normal respiratory effort.  No retractions. Clear to auscultation bilaterally without wheezing or crackles  Gastrointestinal: Soft and nontender.  Musculoskeletal:  no deformity Neurologic:  MAE spontaneously. No gross focal neurologic deficits are appreciated.  Skin:  Skin is warm, dry and intact. No rash noted. Psychiatric: Mood and affect are normal. Speech and  behavior are normal.    ED Results / Procedures / Treatments   Labs (all labs ordered are listed, but only abnormal results are displayed) Labs Reviewed - No data to display   EKG    RADIOLOGY    PROCEDURES:  Critical Care performed:   Procedures   MEDICATIONS ORDERED IN ED: Medications  oxymetazoline (AFRIN) 0.05 % nasal spray 1 spray (1 spray Each Nare Given 11/26/23 2107)  silver nitrate applicators 75-25 % applicator (  Given by Other 11/26/23 2125)  silver nitrate applicators 75-25 % applicator (  Return to [facility] 11/26/23 2126)      IMPRESSION / MDM / ASSESSMENT AND PLAN / ED COURSE  I reviewed the triage vital signs and the nursing notes.  Differential diagnosis includes, but is not limited to, epistaxis, fracture, foreign body  Patient's presentation is most consistent with acute, uncomplicated illness.   Patient's diagnosis is consistent with epistaxis. I did review the patient's allergies and medications.Applied gauze with epinephrine that is stopped the bleeding. the patient is in stable and satisfactory condition for discharge home  Patient will be discharged home without prescriptions.  Gave the patient the Afrin to apply as needed for bleeding. Patient is  to follow up with ENT as needed or otherwise directed. Patient is given ED precautions to return to the ED for any worsening or new symptoms. Discussed plan of care with patient, answered all of patient's questions, Patient agreeable to plan of care. Advised patient to take medications according to the instructions on the label. Discussed possible side effects of new medications. Patient verbalized understanding.    FINAL CLINICAL IMPRESSION(S) / ED DIAGNOSES   Final diagnoses:  Epistaxis, recurrent     Rx / DC Orders   ED Discharge Orders     None        Note:  This document was prepared using Dragon voice recognition software and may include unintentional dictation errors.   Albana Damme, PA-C 11/26/23 2159    Concha Se, MD 11/26/23 2241

## 2023-11-26 NOTE — ED Triage Notes (Signed)
 Pt via POV from home. Pt c/o epistaxis that started 10 mins ago. Pt has a hx of HTN and takes a baby ASA. States that she used a spray of Afrin but it did not control the bleeding. Nasal clamp placed on nose at this time and bleeding controlled. Pt is A&Ox4 and NAD

## 2023-11-26 NOTE — Discharge Instructions (Addendum)
 Have been diagnosed with recurrent epistaxis.  Please apply Afrin as needed for bleeding.  Please call tomorrow make an appointment with Dr. Willeen Cass.  Please come back to ED or go to your PCP you have new symptoms or symptoms worsen.

## 2023-11-27 ENCOUNTER — Emergency Department
Admission: EM | Admit: 2023-11-27 | Discharge: 2023-11-27 | Disposition: A | Discharge status: Home / Self Care | Attending: Emergency Medicine | Admitting: Emergency Medicine

## 2023-11-27 ENCOUNTER — Other Ambulatory Visit: Payer: Self-pay

## 2023-11-27 DIAGNOSIS — R040 Epistaxis: Secondary | ICD-10-CM | POA: Insufficient documentation

## 2023-11-27 LAB — BASIC METABOLIC PANEL
Anion gap: 11 (ref 5–15)
BUN: 12 mg/dL (ref 8–23)
CO2: 24 mmol/L (ref 22–32)
Calcium: 9.6 mg/dL (ref 8.9–10.3)
Chloride: 105 mmol/L (ref 98–111)
Creatinine, Ser: 0.98 mg/dL (ref 0.44–1.00)
GFR, Estimated: 58 mL/min — ABNORMAL LOW (ref >60)
Glucose, Bld: 132 mg/dL — ABNORMAL HIGH (ref 70–99)
Potassium: 3.8 mmol/L (ref 3.5–5.1)
Sodium: 140 mmol/L (ref 135–145)

## 2023-11-27 LAB — CBC WITH DIFFERENTIAL/PLATELET
Abs Immature Granulocytes: 0.02 10*3/uL (ref 0.00–0.07)
Basophils Absolute: 0.1 10*3/uL (ref 0.0–0.1)
Basophils Relative: 1 %
Eosinophils Absolute: 0.2 10*3/uL (ref 0.0–0.5)
Eosinophils Relative: 2 %
HCT: 37.8 % (ref 36.0–46.0)
Hemoglobin: 11.9 g/dL — ABNORMAL LOW (ref 12.0–15.0)
Immature Granulocytes: 0 %
Lymphocytes Relative: 18 %
Lymphs Abs: 1.2 10*3/uL (ref 0.7–4.0)
MCH: 24.9 pg — ABNORMAL LOW (ref 26.0–34.0)
MCHC: 31.5 g/dL (ref 30.0–36.0)
MCV: 79.1 fL — ABNORMAL LOW (ref 80.0–100.0)
Monocytes Absolute: 0.6 10*3/uL (ref 0.1–1.0)
Monocytes Relative: 8 %
Neutro Abs: 4.8 10*3/uL (ref 1.7–7.7)
Neutrophils Relative %: 71 %
Platelets: 179 10*3/uL (ref 150–400)
RBC: 4.78 MIL/uL (ref 3.87–5.11)
RDW: 15.7 % — ABNORMAL HIGH (ref 11.5–15.5)
WBC: 6.8 10*3/uL (ref 4.0–10.5)
nRBC: 0 % (ref 0.0–0.2)

## 2023-11-27 NOTE — Discharge Instructions (Addendum)
 Hold your aspirin for 10 days.  If you develop the nosebleed again then spray the Afrin and put the nasal clamp on and wait 20 to 30 minutes if you continue to have bleeding then return to the emergency room but at this time he wanted to avoid placing any packing in it.  Continue to use Vaseline in the nose to help keep it moist and do not blow your nose  Call Dr. Okey Dupre on Monday if you continue to have issues

## 2023-11-27 NOTE — ED Provider Notes (Addendum)
 [facility] Provider Note    Event Date/Time   First MD Initiated Contact with Patient 11/27/23 2001     (approximate)   History   Epistaxis (recurrent)   HPI  Stefanie Roberts is a 78 y.o. female who reports that she comes in for another nosebleed of the left nostril.  Patient reports being seen by Dr. Okey Dupre with ENT today and they cauterized part of her nose but then she developed some increasing bleeding today after she blew her nose.  She reports some Afrin in there and stuffed some tissues up there and then came back into the emergency room  Patient reports that she is only on aspirin because she was just told to take it preventatively.  She does not report history of A-fib. Physical Exam   Triage Vital Signs: ED Triage Vitals [11/27/23 1733]  Encounter Vitals Group     BP (!) 157/93     Systolic BP Percentile      Diastolic BP Percentile      Pulse Rate 90     Resp 18     Temp 98 F (36.7 C)     Temp Source Oral     SpO2 98 %     Weight 134 lb 7.7 oz (61 kg)     Height 5\' 6"  (1.676 m)     Head Circumference      Peak Flow      Pain Score 0     Pain Loc      Pain Education      Exclude from Growth Chart     Most recent vital signs: Vitals:   11/27/23 1733  BP: (!) 157/93  Pulse: 90  Resp: 18  Temp: 98 F (36.7 C)  SpO2: 98%     General: Awake, no distress.  CV:  Good peripheral perfusion.  Resp:  Normal effort.  Abd:  No distention.  Other:  No active bleeding noted    ED Results / Procedures / Treatments   Labs (all labs ordered are listed, but only abnormal results are displayed) Labs Reviewed  CBC WITH DIFFERENTIAL/PLATELET - Abnormal; Notable for the following components:      Result Value   Hemoglobin 11.9 (*)    MCV 79.1 (*)    MCH 24.9 (*)    RDW 15.7 (*)    All other components within normal limits  BASIC METABOLIC PANEL - Abnormal; Notable for the following components:   Glucose, Bld 132 (*)    GFR,  Estimated 58 (*)    All other components within normal limits     PROCEDURES:  Critical Care performed: No  Procedures   MEDICATIONS ORDERED IN ED: Medications - No data to display   IMPRESSION / MDM / ASSESSMENT AND PLAN / ED COURSE  I reviewed the triage vital signs and the nursing notes.   Patient's presentation is most consistent with acute, uncomplicated illness.   Given this patient second time coming in with this I will get repeat blood work to evaluate for any other causes for recurrent bleeding.  Patient's BMP is reviewed.  Ensuring her CBC shows slightly low hemoglobin of 11.9 but platelets are normal.  She had LFTs done back in November and has no signs of liver failure to suggest coagulopathy  I discussed case with Dr. Okey Dupre who saw her earlier and given there is no additional bleeding he does not recommend putting a Merisel in as he thought that that would actually  make the bleeding worse.  He recommended her to hold her aspirin for 10 days and to continue using Vaseline and if the bleeding starts again to use the Afrin and nasal swab.  If she has any issues over the weekend to return to the emergency room or call his office on Monday and he will take another look but he did not see any evidence of active bleeding when he saw her today but did do some prophylactic cauterizing of some vessels that were a little enlarged.  Patient was monitored in the emergency room with no evidence of return of bleeding and felt comfortable with discharge home     FINAL CLINICAL IMPRESSION(S) / ED DIAGNOSES   Final diagnoses:  Epistaxis     Rx / DC Orders   ED Discharge Orders     None        Note:  This document was prepared using Dragon voice recognition software and may include unintentional dictation errors.   Concha Se, MD 11/27/23 2130    Concha Se, MD 11/27/23 2130

## 2023-11-27 NOTE — ED Triage Notes (Signed)
 Pt to ed from home via POV for nose bleed. Pt was here for same last night. Pt has guaze stuffed in her nostril. Bleeding is controlled at this time. Pt is caox4, in no acute distress. Pt had to have her nose cartarized today at another office and she "blew it today" and busted it loose.
